# Patient Record
Sex: Female | Born: 1955 | Hispanic: Yes | Marital: Married | Smoking: Never smoker
Health system: Southern US, Community
[De-identification: ages and names within clinical notes are randomized; demographics above are authoritative.]

## PROBLEM LIST (undated history)

## (undated) HISTORY — PX: SHOULDER ACROMIOPLASTY: SHX6093

---

## 2004-09-22 ENCOUNTER — Ambulatory Visit: Payer: Self-pay | Admitting: Family Medicine

## 2006-01-03 ENCOUNTER — Ambulatory Visit: Payer: Self-pay | Admitting: Family Medicine

## 2008-08-19 ENCOUNTER — Emergency Department: Payer: Self-pay | Admitting: Emergency Medicine

## 2010-12-25 ENCOUNTER — Ambulatory Visit: Payer: Self-pay

## 2014-02-24 ENCOUNTER — Ambulatory Visit: Payer: Self-pay | Admitting: Family Medicine

## 2017-06-28 ENCOUNTER — Other Ambulatory Visit: Payer: Self-pay | Admitting: Family Medicine

## 2017-06-28 DIAGNOSIS — Z1231 Encounter for screening mammogram for malignant neoplasm of breast: Secondary | ICD-10-CM

## 2017-06-28 DIAGNOSIS — E2839 Other primary ovarian failure: Secondary | ICD-10-CM

## 2017-06-29 ENCOUNTER — Other Ambulatory Visit: Payer: Self-pay | Admitting: Family Medicine

## 2017-06-29 DIAGNOSIS — J329 Chronic sinusitis, unspecified: Secondary | ICD-10-CM

## 2017-08-17 ENCOUNTER — Ambulatory Visit
Admission: RE | Admit: 2017-08-17 | Discharge: 2017-08-17 | Disposition: A | Discharge status: Home / Self Care | Payer: BLUE CROSS/BLUE SHIELD | Source: Ambulatory Visit | Attending: Family Medicine | Admitting: Family Medicine

## 2017-08-17 ENCOUNTER — Encounter: Payer: Self-pay | Admitting: Radiology

## 2017-08-17 DIAGNOSIS — E2839 Other primary ovarian failure: Secondary | ICD-10-CM | POA: Diagnosis present

## 2017-08-17 DIAGNOSIS — Z1231 Encounter for screening mammogram for malignant neoplasm of breast: Secondary | ICD-10-CM | POA: Insufficient documentation

## 2020-01-30 ENCOUNTER — Other Ambulatory Visit: Payer: Self-pay | Admitting: Family Medicine

## 2020-01-30 DIAGNOSIS — Z1231 Encounter for screening mammogram for malignant neoplasm of breast: Secondary | ICD-10-CM

## 2020-02-26 ENCOUNTER — Other Ambulatory Visit: Payer: Self-pay | Admitting: Pediatrics

## 2020-02-26 ENCOUNTER — Ambulatory Visit
Admission: RE | Admit: 2020-02-26 | Discharge: 2020-02-26 | Disposition: A | Discharge status: Home / Self Care | Payer: Self-pay | Source: Ambulatory Visit | Attending: Pediatrics | Admitting: Pediatrics

## 2020-02-26 ENCOUNTER — Other Ambulatory Visit: Payer: Self-pay

## 2020-02-26 DIAGNOSIS — M7502 Adhesive capsulitis of left shoulder: Secondary | ICD-10-CM

## 2020-03-03 ENCOUNTER — Ambulatory Visit
Admission: RE | Admit: 2020-03-03 | Discharge: 2020-03-03 | Disposition: A | Discharge status: Home / Self Care | Payer: Self-pay | Source: Ambulatory Visit | Attending: Family Medicine | Admitting: Family Medicine

## 2020-03-03 ENCOUNTER — Other Ambulatory Visit: Payer: Self-pay

## 2020-03-03 DIAGNOSIS — Z1231 Encounter for screening mammogram for malignant neoplasm of breast: Secondary | ICD-10-CM | POA: Insufficient documentation

## 2020-12-18 ENCOUNTER — Other Ambulatory Visit: Payer: Self-pay | Admitting: Student

## 2020-12-18 DIAGNOSIS — M8000XK Age-related osteoporosis with current pathological fracture, unspecified site, subsequent encounter for fracture with nonunion: Secondary | ICD-10-CM

## 2022-03-30 ENCOUNTER — Other Ambulatory Visit: Payer: Self-pay

## 2022-03-30 ENCOUNTER — Telehealth: Payer: Self-pay

## 2022-03-30 DIAGNOSIS — Z1211 Encounter for screening for malignant neoplasm of colon: Secondary | ICD-10-CM

## 2022-03-30 NOTE — Telephone Encounter (Signed)
Gastroenterology Pre-Procedure Review  Call made with the assistance of [facility] [place]  Request Date: 05/27/22 Requesting Physician: Dr. Marius Ditch  PATIENT REVIEW QUESTIONS: The patient responded to the following health history questions as indicated:    1. Are you having any GI issues? no 2. Do you have a personal history of Polyps? no 3. Do you have a family history of Colon Cancer or Polyps? no 4. Diabetes Mellitus? no 5. Joint replacements in the past 12 months?no 6. Major health problems in the past 3 months?no 7. Any artificial heart valves, MVP, or defibrillator?no    MEDICATIONS & ALLERGIES:    Patient reports the following regarding taking any anticoagulation/antiplatelet therapy:   Plavix, Coumadin, Eliquis, Xarelto, Lovenox, Pradaxa, Brilinta, or Effient? no Aspirin? no  Patient confirms/reports the following medications:  No current outpatient medications on file.   No current facility-administered medications for this visit.    Patient confirms/reports the following allergies:  Not on File  No orders of the defined types were placed in this encounter.   AUTHORIZATION INFORMATION Primary Insurance: 1D#: Group #:  Secondary Insurance: 1D#: Group #:  SCHEDULE INFORMATION: Date: 05/27/22 Time: Location: [facility]

## 2022-04-23 IMAGING — CR DG SHOULDER 2+V*L*
3 series · 3 of 3 positions shown · non-contrast
Comparison: None.

CLINICAL DATA: Left shoulder pain and limited range of motion. No
known injury.

EXAM:
LEFT SHOULDER - 2+ VIEW

[shoulder grashey]
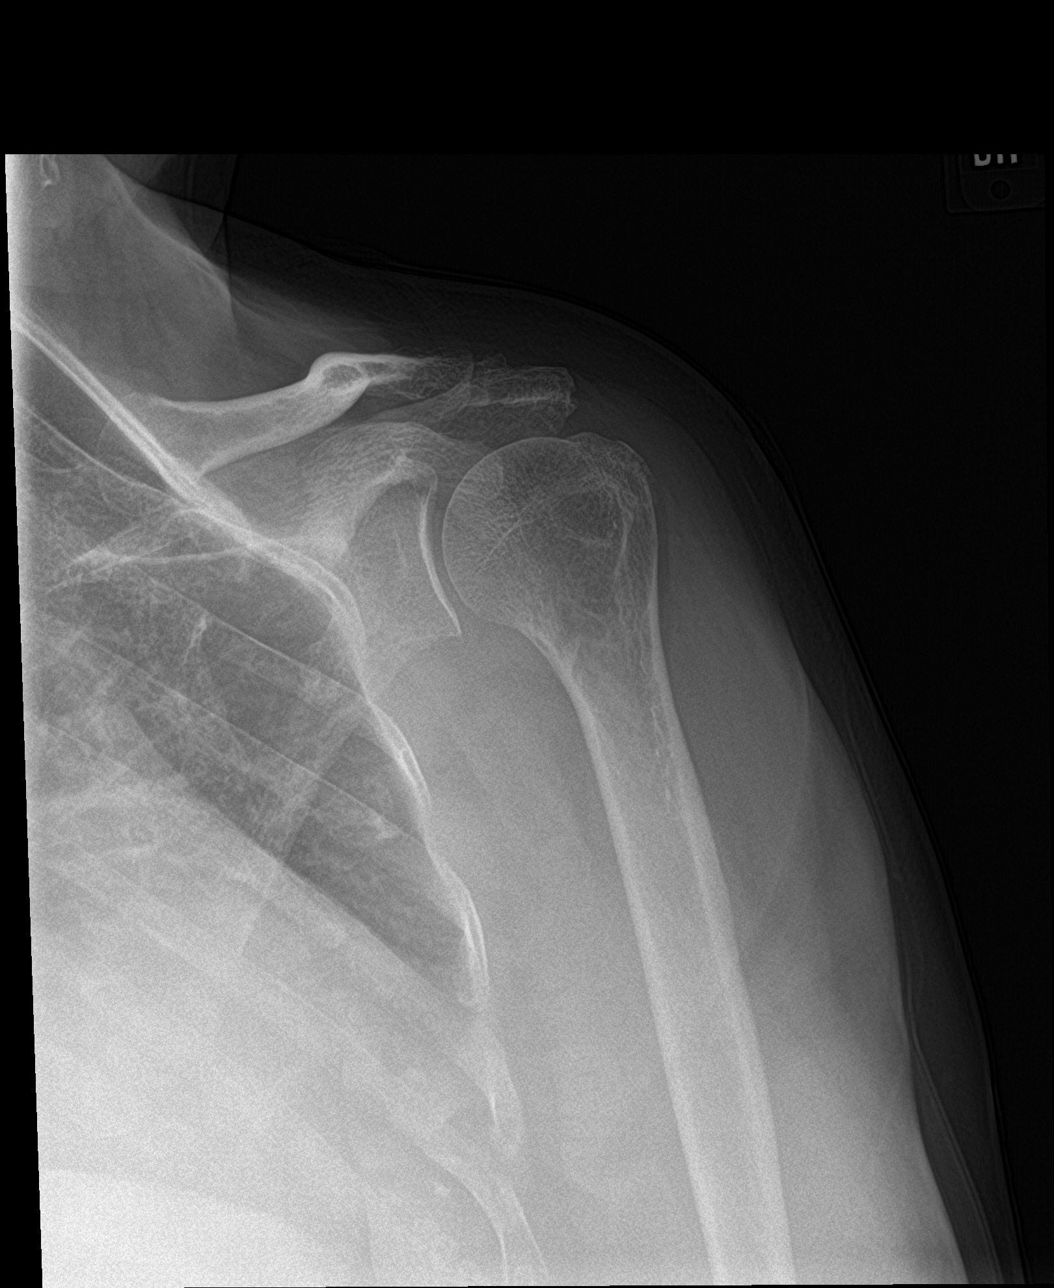

[shoulder y view]
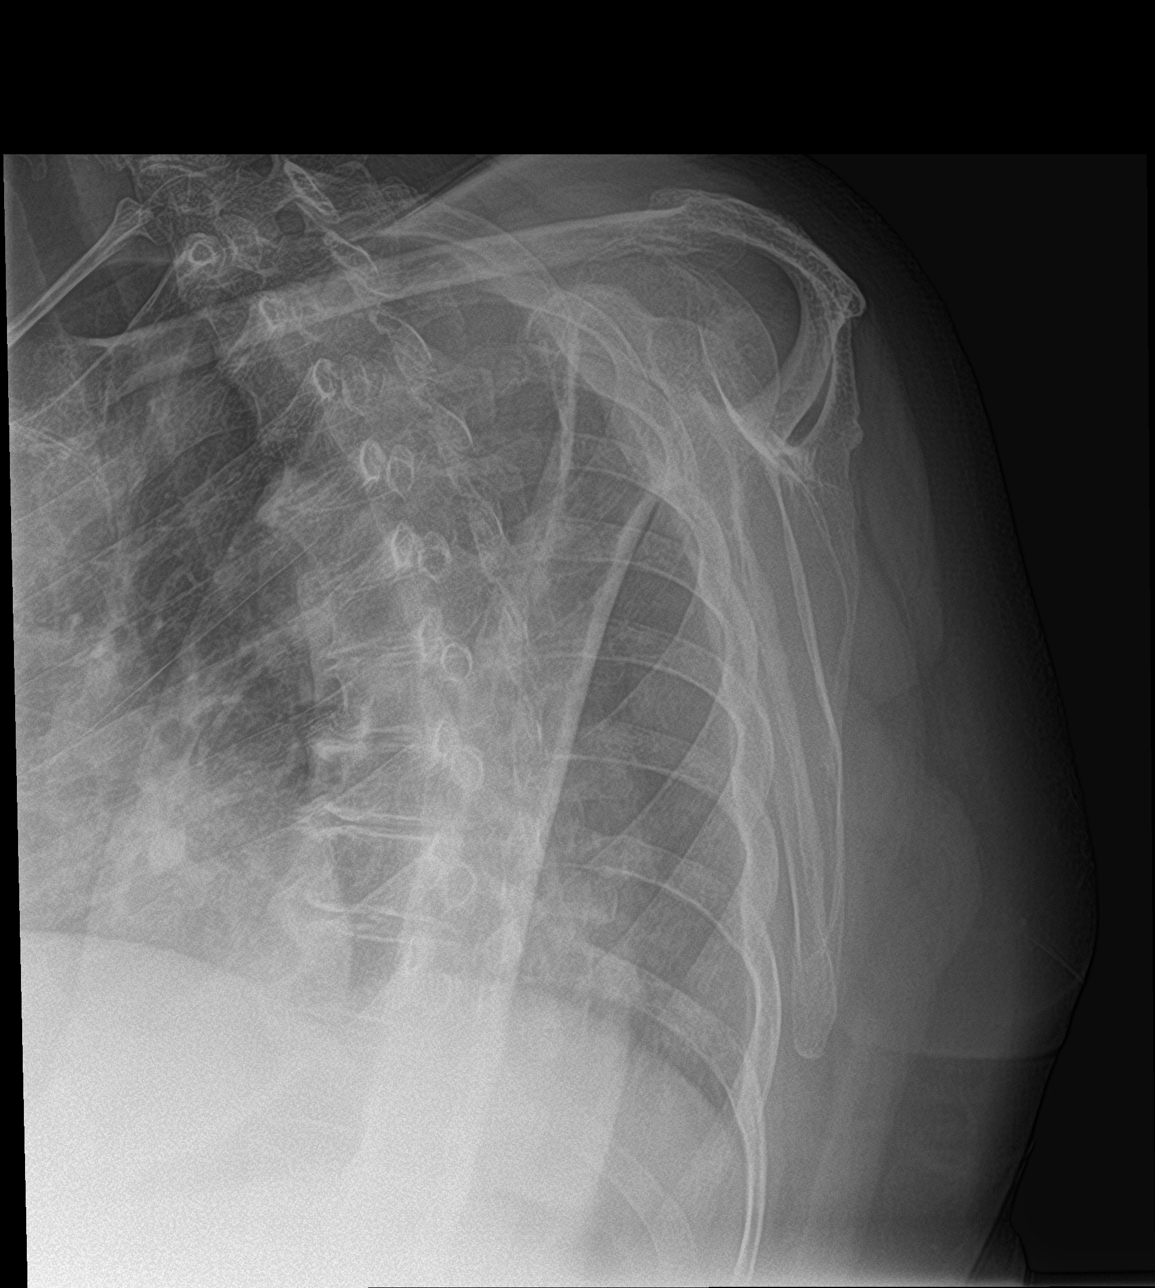

[shoulder axillary]
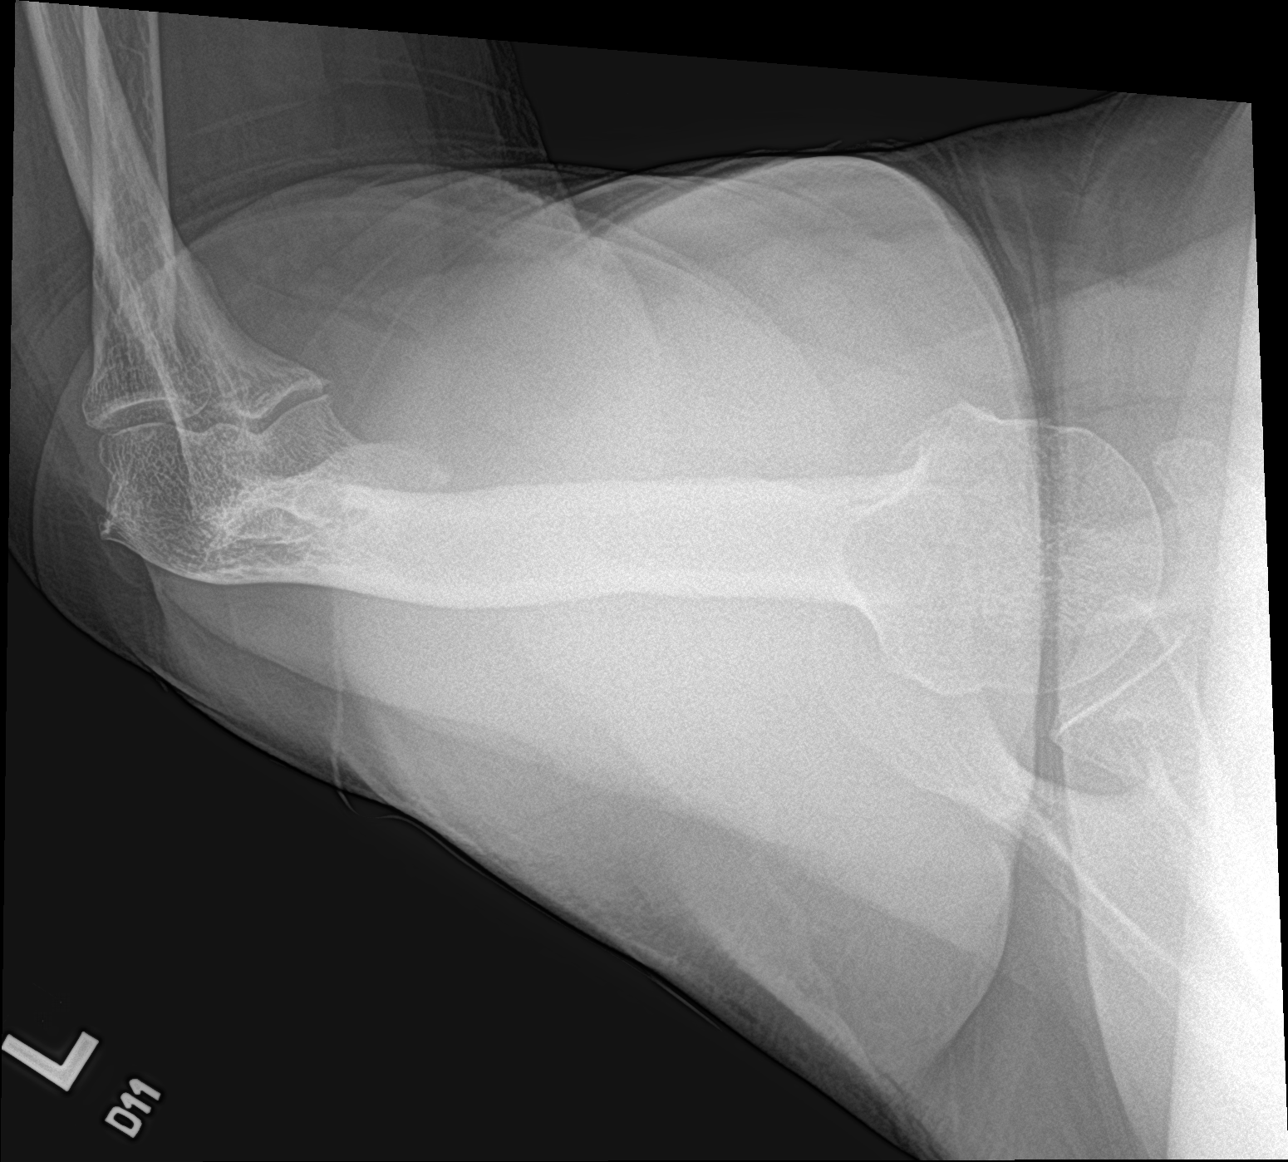

[3 of 3 positions shown; findings below may reference images not displayed]

FINDINGS: There is no evidence of fracture or dislocation. Mild-to-moderate
acromioclavicular osteoarthritis noted. The glenohumeral joint
appears normal. Soft tissues are unremarkable.
IMPRESSION: Mild to moderate acromioclavicular osteoarthritis. Otherwise
negative.

## 2022-05-26 ENCOUNTER — Other Ambulatory Visit: Payer: Self-pay | Admitting: *Deleted

## 2022-05-26 ENCOUNTER — Encounter: Payer: Self-pay | Admitting: Gastroenterology

## 2022-05-26 MED ORDER — NA SULFATE-K SULFATE-MG SULF 17.5-3.13-1.6 GM/177ML PO SOLN: 1.0000 | Freq: Once | ORAL | 0 refills | Status: AC

## 2022-05-27 ENCOUNTER — Encounter
Admission: RE | Disposition: A | Discharge status: Home / Self Care | Payer: Self-pay | Source: Home / Self Care | Attending: Gastroenterology

## 2022-05-27 ENCOUNTER — Ambulatory Visit
Admission: RE | Admit: 2022-05-27 | Discharge: 2022-05-27 | Disposition: A | Discharge status: Home / Self Care | Payer: Medicare HMO | Attending: Gastroenterology | Admitting: Gastroenterology

## 2022-05-27 ENCOUNTER — Encounter: Payer: Self-pay | Admitting: Gastroenterology

## 2022-05-27 ENCOUNTER — Ambulatory Visit: Payer: Medicare HMO | Admitting: Registered Nurse

## 2022-05-27 DIAGNOSIS — K635 Polyp of colon: Secondary | ICD-10-CM | POA: Diagnosis not present

## 2022-05-27 DIAGNOSIS — Z1211 Encounter for screening for malignant neoplasm of colon: Secondary | ICD-10-CM

## 2022-05-27 DIAGNOSIS — G4730 Sleep apnea, unspecified: Secondary | ICD-10-CM | POA: Diagnosis not present

## 2022-05-27 HISTORY — PX: COLONOSCOPY WITH PROPOFOL: SHX5780

## 2022-05-27 SURGERY — COLONOSCOPY WITH PROPOFOL
Anesthesia: General

## 2022-05-27 MED ORDER — ONDANSETRON HCL 4 MG/2ML IJ SOLN
4.0000 mg | Freq: Once | INTRAMUSCULAR | Status: AC
  Administered 2022-05-27: 4 mg via INTRAVENOUS

## 2022-05-27 MED ORDER — PROPOFOL 500 MG/50ML IV EMUL
INTRAVENOUS | Status: DC | PRN
  Administered 2022-05-27: 244.604 ug/kg/min via INTRAVENOUS

## 2022-05-27 MED ORDER — SODIUM CHLORIDE 0.9 % IV SOLN
INTRAVENOUS | Status: DC
  Administered 2022-05-27: 1000 mL via INTRAVENOUS

## 2022-05-27 MED ORDER — ONDANSETRON HCL 4 MG/2ML IJ SOLN
INTRAMUSCULAR | Status: AC
  Filled 2022-05-27: qty 2

## 2022-05-27 MED ORDER — LIDOCAINE HCL (CARDIAC) PF 100 MG/5ML IV SOSY
PREFILLED_SYRINGE | INTRAVENOUS | Status: DC | PRN
  Administered 2022-05-27: 100 mg via INTRAVENOUS

## 2022-05-27 MED ORDER — STERILE WATER FOR IRRIGATION IR SOLN
Status: DC | PRN
  Administered 2022-05-27: 100 mL

## 2022-05-27 MED ORDER — PROPOFOL 10 MG/ML IV BOLUS
INTRAVENOUS | Status: DC | PRN
  Administered 2022-05-27: 80 mg via INTRAVENOUS

## 2022-05-27 NOTE — Anesthesia Preprocedure Evaluation (Addendum)
Anesthesia Evaluation  Patient identified by MRN, date of birth, ID band Patient awake    Reviewed: Allergy & Precautions, NPO status , Patient's Chart, lab work & pertinent test results  History of Anesthesia Complications (+) PROLONGED EMERGENCE and history of anesthetic complications  Airway Mallampati: III  TM Distance: <3 FB Neck ROM: full    Dental  (+) Chipped, Caps   Pulmonary neg shortness of breath, sleep apnea    Pulmonary exam normal        Cardiovascular Exercise Tolerance: Good negative cardio ROS Normal cardiovascular exam     Neuro/Psych negative neurological ROS  negative psych ROS   GI/Hepatic negative GI ROS, Neg liver ROS,neg GERD  ,,  Endo/Other  negative endocrine ROS    Renal/GU negative Renal ROS  negative genitourinary   Musculoskeletal   Abdominal   Peds  Hematology negative hematology ROS (+)   Anesthesia Other Findings History reviewed. No pertinent past medical history.  Past Surgical History: No date: SHOULDER ACROMIOPLASTY; Right     Reproductive/Obstetrics negative OB ROS                             Anesthesia Physical Anesthesia Plan  ASA: 3  Anesthesia Plan: General   Post-op Pain Management:    Induction: Intravenous  PONV Risk Score and Plan: Propofol infusion and TIVA  Airway Management Planned: Natural Airway and Nasal Cannula  Additional Equipment:   Intra-op Plan:   Post-operative Plan:   Informed Consent: I have reviewed the patients History and Physical, chart, labs and discussed the procedure including the risks, benefits and alternatives for the proposed anesthesia with the patient or authorized representative who has indicated his/her understanding and acceptance.     Dental Advisory Given and Interpreter used for interveiw  Plan Discussed with: Anesthesiologist, CRNA and Surgeon  Anesthesia Plan Comments: (Patient  consented for risks of anesthesia including but not limited to:  - adverse reactions to medications - risk of airway placement if required - damage to eyes, teeth, lips or other oral mucosa - nerve damage due to positioning  - sore throat or hoarseness - Damage to heart, brain, nerves, lungs, other parts of body or loss of life  Patient voiced understanding.)       Anesthesia Quick Evaluation

## 2022-05-27 NOTE — Transfer of Care (Signed)
Immediate Anesthesia Transfer of Care Note  Patient: Gentry Roch  Procedure(s) Performed: COLONOSCOPY WITH PROPOFOL  Patient Location: Endoscopy Unit  Anesthesia Type:General  Level of Consciousness: drowsy  Airway & Oxygen Therapy: Patient Spontanous Breathing  Post-op Assessment: Report given to RN and Post -op Vital signs reviewed and stable  Post vital signs: Reviewed and stable  Last Vitals:  Vitals Value Taken Time  BP 96/47 05/27/22 0940  Temp 35.6 C 05/27/22 0938  Pulse 58 05/27/22 0940  Resp 20 05/27/22 0940  SpO2 95 % 05/27/22 0940  Vitals shown include unvalidated device data.  Last Pain:  Vitals:   05/27/22 0938  TempSrc: Temporal  PainSc: Asleep         Complications: No notable events documented.

## 2022-05-27 NOTE — Op Note (Signed)
[facility] Gastroenterology Patient Name: Gina Hoover Procedure Date: 05/27/2022 9:02 AM MRN: [id number] Account #: [id number] Date of Birth: 02-Dec-1955 Admit Type: Outpatient Age: 67 Room: [facility] ENDO ROOM 2 Gender: Female Note Status: Finalized Instrument Name: Prentice Docker [id number] Procedure:             Colonoscopy Indications:           Screening for colorectal malignant neoplasm, This is                         the patient's first colonoscopy Providers:             Toney Reil MD, MD Referring MD:          Toney Reil MD, MD (Referring MD) Medicines:             General Anesthesia Complications:         No immediate complications. Estimated blood loss: None. Procedure:             Pre-Anesthesia Assessment:                        - Prior to the procedure, a History and Physical was                         performed, and patient medications and allergies were                         reviewed. The patient is competent. The risks and                         benefits of the procedure and the sedation options and                         risks were discussed with the patient. All questions                         were answered and informed consent was obtained.                         Patient identification and proposed procedure were                         verified by the physician, the nurse, the                         anesthesiologist, the anesthetist and the technician                         in the pre-procedure area in the procedure room in the                         endoscopy suite. Mental Status Examination: alert and                         oriented. Airway Examination: normal oropharyngeal                         airway and neck mobility. Respiratory Examination:  clear to auscultation. CV Examination: normal.                         Prophylactic Antibiotics: The patient does not require                          prophylactic antibiotics. Prior Anticoagulants: The                         patient has taken no anticoagulant or antiplatelet                         agents. ASA Grade Assessment: III - A patient with                         severe systemic disease. After reviewing the risks and                         benefits, the patient was deemed in satisfactory                         condition to undergo the procedure. The anesthesia                         plan was to use general anesthesia. Immediately prior                         to administration of medications, the patient was                         re-assessed for adequacy to receive sedatives. The                         heart rate, respiratory rate, oxygen saturations,                         blood pressure, adequacy of pulmonary ventilation, and                         response to care were monitored throughout the                         procedure. The physical status of the patient was                         re-assessed after the procedure.                        After obtaining informed consent, the colonoscope was                         passed under direct vision. Throughout the procedure,                         the patient's blood pressure, pulse, and oxygen                         saturations were monitored continuously. The  Colonoscope was introduced through the anus and                         advanced to the the cecum, identified by appendiceal                         orifice and ileocecal valve. The colonoscopy was                         performed without difficulty. The patient tolerated                         the procedure well. The quality of the bowel                         preparation was evaluated using the BBPS [facility] Bowel                         Preparation Scale) with scores of: Right Colon = 3,                         Transverse Colon = 3 and Left Colon = 3 (entire mucosa                          seen well with no residual staining, small fragments                         of stool or opaque liquid). The total BBPS score                         equals 9. The ileocecal valve, appendiceal orifice,                         and rectum were photographed. Findings:      The perianal and digital rectal examinations were normal. Pertinent       negatives include normal sphincter tone and no palpable rectal lesions.      A 3 mm polyp was found in the transverse colon. The polyp was sessile.       The polyp was removed with a cold snare. Resection and retrieval were       complete. Estimated blood loss: none.      The exam was otherwise without abnormality. Impression:            - One 3 mm polyp in the transverse colon, removed with                         a cold snare. Resected and retrieved.                        - The examination was otherwise normal. Recommendation:        - Discharge patient to home (with escort).                        - Resume previous diet today.                        - Continue present medications.                        -  Await pathology results.                        - Repeat colonoscopy in 7-10 years for surveillance                         based on pathology results. Procedure Code(s):     --- Professional ---                        [phone], Colonoscopy, flexible; with removal of                         tumor(s), polyp(s), or other lesion(s) by snare                         technique Diagnosis Code(s):     --- Professional ---                        Z12.11, Encounter for screening for malignant neoplasm                         of colon                        D12.3, Benign neoplasm of transverse colon (hepatic                         flexure or splenic flexure) CPT copyright 2022 American Medical Association. All rights reserved. The codes documented in this report are preliminary and upon coder review may  be revised to meet current  compliance requirements. Dr. Libby Maw Toney Reil MD, MD 05/27/2022 9:35:32 AM This report has been signed electronically. Number of Addenda: 0 Note Initiated On: 05/27/2022 9:02 AM Scope Withdrawal Time: 0 hours 8 minutes 32 seconds  Total Procedure Duration: 0 hours 12 minutes 19 seconds  Estimated Blood Loss:  Estimated blood loss: none.      [facility]

## 2022-05-27 NOTE — Anesthesia Postprocedure Evaluation (Signed)
Anesthesia Post Note  Patient: Gina Hoover  Procedure(s) Performed: COLONOSCOPY WITH PROPOFOL  Patient location during evaluation: Endoscopy Anesthesia Type: General Level of consciousness: awake and alert Pain management: pain level controlled Vital Signs Assessment: post-procedure vital signs reviewed and stable Respiratory status: spontaneous breathing, nonlabored ventilation, respiratory function stable and patient connected to nasal cannula oxygen Cardiovascular status: blood pressure returned to baseline and stable Postop Assessment: no apparent nausea or vomiting Anesthetic complications: no   No notable events documented.   Last Vitals:  Vitals:   05/27/22 0902 05/27/22 0938  BP: (!) 131/54 (!) 96/47  Pulse: (!) 53 (!) 58  Resp: 18 19  Temp: (!) 36.1 C (!) 35.6 C  SpO2: 97% 96%    Last Pain:  Vitals:   05/27/22 0938  TempSrc: Temporal  PainSc: Asleep                 Cleda Mccreedy [name]

## 2022-05-27 NOTE — H&P (Signed)
  Arlyss Repress, MD [address]  [address]  [place], [place] [address]  Main: [phone]  Fax: [phone] Pager: [phone]  Primary Care Physician:  Preston Fleeting, MD Primary Gastroenterologist:  Dr. Arlyss Repress  Pre-Procedure History & Physical: HPI:  Gina Hoover is a 67 y.o. female is here for an colonoscopy.   History reviewed. No pertinent past medical history.  Past Surgical History:  Procedure Laterality Date   SHOULDER ACROMIOPLASTY Right     Prior to Admission medications   Medication Sig Start Date End Date Taking? Authorizing Provider  Vitamin D, Ergocalciferol, (DRISDOL) 1.25 MG (50000 UNIT) CAPS capsule Take 50,000 Units by mouth every 7 (seven) days.   Yes [provider]    Allergies as of 03/30/2022   (No Known Allergies)    History reviewed. No pertinent family history.  Social History   Socioeconomic History   Marital status: Married    Spouse name: Not on file   Number of children: Not on file   Years of education: Not on file   Highest education level: Not on file  Occupational History   Not on file  Tobacco Use   Smoking status: Never   Smokeless tobacco: Never  Vaping Use   Vaping Use: Never used  Substance and Sexual Activity   Alcohol use: Never   Drug use: Never   Sexual activity: Not on file  Other Topics Concern   Not on file  Social History Narrative   Not on file   Social Determinants of Health   Financial Resource Strain: Not on file  Food Insecurity: Not on file  Transportation Needs: Not on file  Physical Activity: Not on file  Stress: Not on file  Social Connections: Not on file  Intimate Partner Violence: Not on file    Review of Systems: See HPI, otherwise negative ROS  Physical Exam: BP (!) 96/47   Pulse (!) 58   Temp (!) 96 F (35.6 C) (Temporal)   Resp 19   Ht 4\' 6"  (1.372 m)   Wt 153 lb 4.9 oz (69.5 kg)   SpO2 96%   BMI 36.96 kg/m  General:    Alert,  pleasant and cooperative in NAD Head:  Normocephalic and atraumatic. Neck:  Supple; no masses or thyromegaly. Lungs:  Clear throughout to auscultation.    Heart:  Regular rate and rhythm. Abdomen:  Soft, nontender and nondistended. Normal bowel sounds, without guarding, and without rebound.   Neurologic:  Alert and  oriented x4;  grossly normal neurologically.  Impression/Plan: Gina Hoover is here for an colonoscopy to be performed for colon cancer screening  Risks, benefits, limitations, and alternatives regarding  colonoscopy have been reviewed with the patient.  Questions have been answered.  All parties agreeable.   Lannette Donath, MD  05/27/2022, 9:42 AM

## 2022-05-27 NOTE — OR Nursing (Signed)
Pt suddenly reports abdominal. Started wretching. Vomited small amount of clear fluid . Rn spoke with DR Earnie Larsson WHO ORDERED ZOFRAN 4MG  IVP. RN ADM MED PER MD ORDERS

## 2022-05-30 ENCOUNTER — Encounter: Payer: Self-pay | Admitting: Gastroenterology

## 2022-05-30 LAB — SURGICAL PATHOLOGY

## 2022-08-23 ENCOUNTER — Other Ambulatory Visit: Payer: Self-pay | Admitting: Physician Assistant

## 2022-08-23 DIAGNOSIS — G500 Trigeminal neuralgia: Secondary | ICD-10-CM

## 2022-09-08 ENCOUNTER — Encounter: Payer: Self-pay | Admitting: Physician Assistant

## 2022-09-13 ENCOUNTER — Ambulatory Visit
Admission: RE | Admit: 2022-09-13 | Discharge: 2022-09-13 | Disposition: A | Discharge status: Home / Self Care | Payer: Medicare HMO | Source: Ambulatory Visit | Attending: Physician Assistant | Admitting: Physician Assistant

## 2022-09-13 DIAGNOSIS — G500 Trigeminal neuralgia: Secondary | ICD-10-CM

## 2022-09-13 MED ORDER — GADOPICLENOL 0.5 MMOL/ML IV SOLN
7.5000 mL | Freq: Once | INTRAVENOUS | Status: AC | PRN
  Administered 2022-09-13: 7 mL via INTRAVENOUS

## 2022-09-26 ENCOUNTER — Other Ambulatory Visit: Payer: Self-pay | Admitting: Physician Assistant

## 2022-09-26 DIAGNOSIS — I729 Aneurysm of unspecified site: Secondary | ICD-10-CM

## 2022-10-03 ENCOUNTER — Ambulatory Visit
Admission: RE | Admit: 2022-10-03 | Discharge: 2022-10-03 | Disposition: A | Discharge status: Home / Self Care | Payer: Medicare HMO | Source: Ambulatory Visit | Attending: Physician Assistant | Admitting: Physician Assistant

## 2022-10-03 DIAGNOSIS — I729 Aneurysm of unspecified site: Secondary | ICD-10-CM

## 2022-10-03 MED ORDER — IOPAMIDOL (ISOVUE-370) INJECTION 76%
75.0000 mL | Freq: Once | INTRAVENOUS | Status: AC | PRN
  Administered 2022-10-03: 75 mL via INTRAVENOUS

## 2023-01-29 ENCOUNTER — Emergency Department
Admission: EM | Admit: 2023-01-29 | Discharge: 2023-01-29 | Discharge status: Against Medical Advice | Payer: Medicare HMO | Attending: Emergency Medicine | Admitting: Emergency Medicine

## 2023-01-29 DIAGNOSIS — R410 Disorientation, unspecified: Secondary | ICD-10-CM | POA: Insufficient documentation

## 2023-01-29 DIAGNOSIS — Z5321 Procedure and treatment not carried out due to patient leaving prior to being seen by health care provider: Secondary | ICD-10-CM | POA: Diagnosis not present

## 2023-01-29 DIAGNOSIS — R55 Syncope and collapse: Secondary | ICD-10-CM | POA: Insufficient documentation

## 2023-01-29 DIAGNOSIS — R251 Tremor, unspecified: Secondary | ICD-10-CM | POA: Diagnosis not present

## 2023-01-29 DIAGNOSIS — R0602 Shortness of breath: Secondary | ICD-10-CM | POA: Diagnosis present

## 2023-01-29 NOTE — ED Triage Notes (Signed)
 Pt comes via EMS from [place] with c/o sob and syncopal episode. Pt had some shaking maybe seizure and was confused afterwards.  Pt at baseline now. Family with pt.   VSS

## 2023-01-29 NOTE — ED Notes (Signed)
 IV removed and pt wanting to go somewhere else. Family with pt. Pt ambulatory out of ED lobby.

## 2023-08-10 ENCOUNTER — Other Ambulatory Visit: Payer: Self-pay | Admitting: Family Medicine

## 2023-08-10 ENCOUNTER — Ambulatory Visit
Admission: RE | Admit: 2023-08-10 | Discharge: 2023-08-10 | Disposition: A | Discharge status: Home / Self Care | Source: Ambulatory Visit | Attending: Family Medicine | Admitting: Family Medicine

## 2023-08-10 DIAGNOSIS — M25551 Pain in right hip: Secondary | ICD-10-CM

## 2023-09-02 ENCOUNTER — Ambulatory Visit
Admission: EM | Admit: 2023-09-02 | Discharge: 2023-09-02 | Disposition: A | Discharge status: Home / Self Care | Attending: Emergency Medicine | Admitting: Emergency Medicine

## 2023-09-02 ENCOUNTER — Encounter: Payer: Self-pay | Admitting: Emergency Medicine

## 2023-09-02 DIAGNOSIS — U071 COVID-19: Secondary | ICD-10-CM | POA: Diagnosis not present

## 2023-09-02 DIAGNOSIS — B349 Viral infection, unspecified: Secondary | ICD-10-CM

## 2023-09-02 LAB — POC SOFIA SARS ANTIGEN FIA: SARS Coronavirus 2 Ag: POSITIVE — AB

## 2023-09-02 MED ORDER — ONDANSETRON 4 MG PO TBDP: 4.0000 mg | ORAL_TABLET | Freq: Three times a day (TID) | ORAL | 0 refills | Status: AC | PRN

## 2023-09-02 MED ORDER — MECLIZINE HCL 12.5 MG PO TABS: 12.5000 mg | ORAL_TABLET | Freq: Three times a day (TID) | ORAL | 0 refills | Status: AC | PRN

## 2023-09-02 MED ORDER — BACLOFEN 5 MG PO TABS: 5.0000 mg | ORAL_TABLET | Freq: Three times a day (TID) | ORAL | 0 refills | Status: AC

## 2023-09-02 MED ORDER — PREDNISONE 10 MG (21) PO TBPK: ORAL_TABLET | Freq: Every day | ORAL | 0 refills | Status: AC

## 2023-09-02 NOTE — ED Triage Notes (Signed)
 Patient complains of  head ache nausea, body aches, chills, fever of 101 on yesterday.  Patient has been taking OTC cough meds Tylenol cold and flu. Last dose last night. Rate pain 8/10.

## 2023-09-02 NOTE — Discharge Instructions (Addendum)
 Covid 19 is a virus and should steadily improve in time it can take up to 7 to 10 days before you truly start to see a turnaround however things will get better    Per the CDC you will need to quarantine and to your 24 hours without fever, if no fever may continue activity wearing mask  You may use meclizine  every 8 hours as needed for dizziness  May use Zofran  every 8 hours as needed for nausea, placed under tongue and allow to dissolve  May use baclofen  every 8 hours as needed for body aches  Begin prednisone  every morning with food to reduce inflammation and help with chest discomfort do not take ibuprofen with this medicine  You can take Tylenol as needed for fever reduction and pain relief.   For cough: honey 1/2 to 1 teaspoon (you can dilute the honey in water  or another fluid).  You can also use guaifenesin and dextromethorphan for cough. You can use a humidifier for chest congestion and cough.  If you don't have a humidifier, you can sit in the bathroom with the hot shower running.      For sore throat: try warm salt water  gargles, cepacol lozenges, throat spray, warm tea or water  with lemon/honey, popsicles or ice, or OTC cold relief medicine for throat discomfort.   For congestion: take a daily anti-histamine like Zyrtec, Claritin, and a oral decongestant, such as pseudoephedrine.  You can also use Flonase 1-2 sprays in each nostril daily.   It is important to stay hydrated: drink plenty of fluids (water , gatorade/powerade/pedialyte, juices, or teas) to keep your throat moisturized and help further relieve irritation/discomfort.

## 2023-09-02 NOTE — ED Provider Notes (Signed)
 [facility]    CSN: [id number] Arrival date & time: 09/02/23  1452      History   Chief Complaint Chief Complaint  Patient presents with   Generalized Body Aches   Fever   Headache   Chills    HPI [facility] Gina Hoover is a 68 y.o. female.   Patient presents for evaluation of fever, mild nonproductive cough, nasal congestion, sore throat, centralized chest pain, centralized abdominal pain and nausea without vomiting beginning 1 day ago.  Known sick contacts in household.  Decreased appetite but tolerable to some food and liquids.  Has not attempted treatment.  History reviewed. No pertinent past medical history.  Patient Active Problem List   Diagnosis Date Noted   Encounter for screening colonoscopy 05/27/2022   Polyp of descending colon 05/27/2022    Past Surgical History:  Procedure Laterality Date   COLONOSCOPY WITH PROPOFOL  N/A 05/27/2022   Procedure: COLONOSCOPY WITH PROPOFOL ;  Surgeon: Unk Corinn Skiff, MD;  Location: [facility] ENDOSCOPY;  Service: Gastroenterology;  Laterality: N/A;  SPANISH INTERPRETER   SHOULDER ACROMIOPLASTY Right     OB History   No obstetric history on file.      Home Medications    Prior to Admission medications   Medication Sig Start Date End Date Taking? Authorizing Provider  aspirin 81 MG chewable tablet Chew 81 mg by mouth daily. 06/28/23 12/25/23 Yes [provider]  baclofen  5 MG TABS Take 1 tablet (5 mg total) by mouth 3 (three) times daily. 09/02/23  Yes Odin Mariani R, NP  ipratropium (ATROVENT) 0.06 % nasal spray Place 1 spray into both nostrils. 09/02/23  Yes [provider]  meclizine  (ANTIVERT ) 12.5 MG tablet Take 1 tablet (12.5 mg total) by mouth 3 (three) times daily as needed for dizziness. 09/02/23  Yes Xayvion Shirah R, NP  ondansetron  (ZOFRAN -ODT) 4 MG disintegrating tablet Take 1 tablet (4 mg total) by mouth every 8 (eight) hours as needed. 09/02/23  Yes Joandy Burget R, NP   predniSONE  (STERAPRED UNI-PAK 21 TAB) 10 MG (21) TBPK tablet Take by mouth daily. Take 6 tabs by mouth daily  for 1 days, then 5 tabs for 1 days, then 4 tabs for 1 days, then 3 tabs for 1 days, 2 tabs for 1 days, then 1 tab by mouth daily for 1 days 09/02/23  Yes Crucita Lacorte R, NP  pregabalin (LYRICA) 25 MG capsule Take 25 mg by mouth 2 (two) times daily. 06/20/23  Yes [provider]  pregabalin (LYRICA) 50 MG capsule Take 50 mg by mouth 2 (two) times daily. 08/03/23  Yes [provider]  Vitamin D, Ergocalciferol, (DRISDOL) 1.25 MG (50000 UNIT) CAPS capsule Take 50,000 Units by mouth every 7 (seven) days.    [provider]    Family History History reviewed. No pertinent family history.  Social History Social History   Tobacco Use   Smoking status: Never   Smokeless tobacco: Never  Vaping Use   Vaping status: Never Used  Substance Use Topics   Alcohol use: Never   Drug use: Never     Allergies   Patient has no known allergies.   Review of Systems Review of Systems   Physical Exam Triage Vital Signs ED Triage Vitals  Encounter Vitals Group     BP 09/02/23 1508 119/80     Girls Systolic BP Percentile --      Girls Diastolic BP Percentile --      Boys Systolic BP Percentile --  Boys Diastolic BP Percentile --      Pulse Rate 09/02/23 1508 71     Resp 09/02/23 1508 18     Temp 09/02/23 1508 98.9 F (37.2 C)     Temp Source 09/02/23 1508 Oral     SpO2 09/02/23 1508 98 %     Weight --      Height --      Head Circumference --      Peak Flow --      Pain Score 09/02/23 1504 8     Pain Loc --      Pain Education --      Exclude from Growth Chart --    No data found.  Updated Vital Signs BP 119/80 (BP Location: Left Arm)   Pulse 71   Temp 98.9 F (37.2 C) (Oral)   Resp 18   SpO2 98%   Visual Acuity Right Eye Distance:   Left Eye Distance:   Bilateral Distance:    Right Eye Near:   Left Eye Near:    Bilateral Near:      Physical Exam Constitutional:      Appearance: Normal appearance.  HENT:     Head: Normocephalic.     Right Ear: Tympanic membrane, ear canal and external ear normal.     Left Ear: Tympanic membrane, ear canal and external ear normal.     Nose: Congestion present.     Mouth/Throat:     Mouth: Mucous membranes are moist.     Pharynx: Oropharynx is clear.  Eyes:     Extraocular Movements: Extraocular movements intact.  Cardiovascular:     Rate and Rhythm: Normal rate and regular rhythm.     Pulses: Normal pulses.     Heart sounds: Normal heart sounds.  Pulmonary:     Effort: Pulmonary effort is normal.     Breath sounds: Normal breath sounds.  Musculoskeletal:     Cervical back: Normal range of motion and neck supple.  Neurological:     Mental Status: She is alert and oriented to person, place, and time. Mental status is at baseline.      UC Treatments / Results  Labs (all labs ordered are listed, but only abnormal results are displayed) Labs Reviewed  POC SOFIA SARS ANTIGEN FIA - Abnormal; Notable for the following components:      Result Value   SARS Coronavirus 2 Ag Positive (*)    All other components within normal limits    EKG   Radiology No results found.  Procedures Procedures (including critical care time)  Medications Ordered in UC Medications - No data to display  Initial Impression / Assessment and Plan / UC Course  I have reviewed the triage vital signs and the nursing notes.  Pertinent labs & imaging results that were available during my care of the patient were reviewed by me and considered in my medical decision making (see chart for details).  COVID-19, viral illness  Patient is in no signs of distress nor toxic appearing.  Vital signs are stable.  Low suspicion for pneumonia, pneumothorax or bronchitis and therefore will defer imaging.  Discussed quarantine.  Prescribed meclizine , Zofran , baclofen  and prednisone . May use additional  over-the-counter medications as needed for supportive care.  May follow-up with urgent care as needed if symptoms persist or worsen.   Final Clinical Impressions(s) / UC Diagnoses   Final diagnoses:  Viral illness  COVID-19     Discharge Instructions  Covid 19 is a virus and should steadily improve in time it can take up to 7 to 10 days before you truly start to see a turnaround however things will get better    Per the CDC you will need to quarantine and to your 24 hours without fever, if no fever may continue activity wearing mask  You may use meclizine  every 8 hours as needed for dizziness  May use Zofran  every 8 hours as needed for nausea, placed under tongue and allow to dissolve  May use baclofen  every 8 hours as needed for body aches  Begin prednisone  every morning with food to reduce inflammation and help with chest discomfort do not take ibuprofen with this medicine  You can take Tylenol as needed for fever reduction and pain relief.   For cough: honey 1/2 to 1 teaspoon (you can dilute the honey in water  or another fluid).  You can also use guaifenesin and dextromethorphan for cough. You can use a humidifier for chest congestion and cough.  If you don't have a humidifier, you can sit in the bathroom with the hot shower running.      For sore throat: try warm salt water  gargles, cepacol lozenges, throat spray, warm tea or water  with lemon/honey, popsicles or ice, or OTC cold relief medicine for throat discomfort.   For congestion: take a daily anti-histamine like Zyrtec, Claritin, and a oral decongestant, such as pseudoephedrine.  You can also use Flonase 1-2 sprays in each nostril daily.   It is important to stay hydrated: drink plenty of fluids (water , gatorade/powerade/pedialyte, juices, or teas) to keep your throat moisturized and help further relieve irritation/discomfort.    ED Prescriptions     Medication Sig Dispense Auth. Provider   meclizine  (ANTIVERT )  12.5 MG tablet Take 1 tablet (12.5 mg total) by mouth 3 (three) times daily as needed for dizziness. 30 tablet Hamsa Laurich R, NP   ondansetron  (ZOFRAN -ODT) 4 MG disintegrating tablet Take 1 tablet (4 mg total) by mouth every 8 (eight) hours as needed. 20 tablet Jesselle Laflamme R, NP   baclofen  5 MG TABS Take 1 tablet (5 mg total) by mouth 3 (three) times daily. 20 tablet Juelz Claar R, NP   predniSONE  (STERAPRED UNI-PAK 21 TAB) 10 MG (21) TBPK tablet Take by mouth daily. Take 6 tabs by mouth daily  for 1 days, then 5 tabs for 1 days, then 4 tabs for 1 days, then 3 tabs for 1 days, 2 tabs for 1 days, then 1 tab by mouth daily for 1 days 21 tablet Rachelanne Whidby, Shelba SAUNDERS, NP      PDMP not reviewed this encounter.   Teresa Shelba SAUNDERS, NP 09/02/23 [phone]

## 2024-01-05 ENCOUNTER — Encounter: Payer: Self-pay | Admitting: Emergency Medicine

## 2024-01-05 ENCOUNTER — Ambulatory Visit
Admission: EM | Admit: 2024-01-05 | Discharge: 2024-01-05 | Disposition: A | Discharge status: Home / Self Care | Attending: Emergency Medicine | Admitting: Emergency Medicine

## 2024-01-05 DIAGNOSIS — B349 Viral infection, unspecified: Secondary | ICD-10-CM

## 2024-01-05 DIAGNOSIS — U071 COVID-19: Secondary | ICD-10-CM

## 2024-01-05 LAB — POC COVID19/FLU A&B COMBO
Covid Antigen, POC: POSITIVE — AB
Influenza A Antigen, POC: NEGATIVE
Influenza B Antigen, POC: NEGATIVE

## 2024-01-05 MED ORDER — PAXLOVID (300/100) 20 X 150 MG & 10 X 100MG PO TBPK: 3.0000 | ORAL_TABLET | Freq: Two times a day (BID) | ORAL | 0 refills | Status: AC

## 2024-01-05 NOTE — ED Provider Notes (Signed)
 " [facility]    CSN: [id number] Arrival date & time: 01/05/24  1612      History   Chief Complaint Chief Complaint  Patient presents with   Facial Pain   Generalized Body Aches    HPI Gina Hoover is a 68 y.o. female.    Presents for evaluation of sinus pain and pressure to the bridge of the nose, generalized abdominal pain, nausea without.  Exposure to influenza within household.  Has attempted use of NyQuil TheraFlu and nasal spray which has been minimally effective.  Tolerable food and liquids.  Denies fever, cough.    History reviewed. No pertinent past medical history.  Patient Active Problem List   Diagnosis Date Noted   Encounter for screening colonoscopy 05/27/2022   Polyp of descending colon 05/27/2022    Past Surgical History:  Procedure Laterality Date   COLONOSCOPY WITH PROPOFOL  N/A 05/27/2022   Procedure: COLONOSCOPY WITH PROPOFOL ;  Surgeon: Unk Corinn Skiff, MD;  Location: [facility] ENDOSCOPY;  Service: Gastroenterology;  Laterality: N/A;  SPANISH INTERPRETER   SHOULDER ACROMIOPLASTY Right     OB History   No obstetric history on file.      Home Medications    Prior to Admission medications  Medication Sig Start Date End Date Taking? Authorizing Provider  aspirin 81 MG chewable tablet Chew 81 mg by mouth daily. 06/28/23  Yes [provider]  baclofen  5 MG TABS Take 1 tablet (5 mg total) by mouth 3 (three) times daily. 09/02/23   Rhyleigh Grassel R, NP  ipratropium (ATROVENT) 0.06 % nasal spray Place 1 spray into both nostrils. 09/02/23   [provider]  meclizine  (ANTIVERT ) 12.5 MG tablet Take 1 tablet (12.5 mg total) by mouth 3 (three) times daily as needed for dizziness. 09/02/23   Teresa Shelba SAUNDERS, NP  metFORMIN (GLUCOPHAGE) 500 MG tablet Take 500 mg by mouth 2 (two) times daily.    [provider]  ondansetron  (ZOFRAN -ODT) 4 MG disintegrating tablet Take 1 tablet (4 mg total) by mouth every 8  (eight) hours as needed. 09/02/23   Vestal Crandall, Shelba SAUNDERS, NP  predniSONE  (STERAPRED UNI-PAK 21 TAB) 10 MG (21) TBPK tablet Take by mouth daily. Take 6 tabs by mouth daily  for 1 days, then 5 tabs for 1 days, then 4 tabs for 1 days, then 3 tabs for 1 days, 2 tabs for 1 days, then 1 tab by mouth daily for 1 days 09/02/23   Teresa Shelba SAUNDERS, NP  pregabalin (LYRICA) 25 MG capsule Take 25 mg by mouth 2 (two) times daily. 06/20/23   [provider]  pregabalin (LYRICA) 50 MG capsule Take 50 mg by mouth 2 (two) times daily. 08/03/23   [provider]  Vitamin D, Ergocalciferol, (DRISDOL) 1.25 MG (50000 UNIT) CAPS capsule Take 50,000 Units by mouth every 7 (seven) days.    [provider]    Family History History reviewed. No pertinent family history.  Social History Social History[1]   Allergies   Patient has no known allergies.   Review of Systems Review of Systems  Constitutional: Negative.   HENT:  Positive for sinus pressure and sinus pain. Negative for congestion, dental problem, drooling, ear discharge, ear pain, facial swelling, hearing loss, mouth sores, nosebleeds, postnasal drip, rhinorrhea, sneezing, sore throat, tinnitus, trouble swallowing and voice change.   Respiratory: Negative.    Cardiovascular: Negative.   Gastrointestinal:  Positive for abdominal pain and nausea. Negative for abdominal distention, anal bleeding, blood  in stool, constipation, diarrhea, rectal pain and vomiting.  Musculoskeletal:  Positive for myalgias. Negative for arthralgias, back pain, gait problem, joint swelling, neck pain and neck stiffness.  Neurological: Negative.      Physical Exam Triage Vital Signs ED Triage Vitals  Encounter Vitals Group     BP 01/05/24 1703 106/68     Girls Systolic BP Percentile --      Girls Diastolic BP Percentile --      Boys Systolic BP Percentile --      Boys Diastolic BP Percentile --      Pulse Rate 01/05/24 1703 67     Resp 01/05/24 1703  17     Temp 01/05/24 1703 97.9 F (36.6 C)     Temp Source 01/05/24 1703 Oral     SpO2 01/05/24 1703 97 %     Weight --      Height --      Head Circumference --      Peak Flow --      Pain Score 01/05/24 1700 6     Pain Loc --      Pain Education --      Exclude from Growth Chart --    No data found.  Updated Vital Signs BP 106/68 (BP Location: Left Arm)   Pulse 67   Temp 97.9 F (36.6 C) (Oral)   Resp 17   SpO2 97%   Visual Acuity Right Eye Distance:   Left Eye Distance:   Bilateral Distance:    Right Eye Near:   Left Eye Near:    Bilateral Near:     Physical Exam   UC Treatments / Results  Labs (all labs ordered are listed, but only abnormal results are displayed) Labs Reviewed  POC COVID19/FLU A&B COMBO    EKG   Radiology No results found.  Procedures Procedures (including critical care time)  Medications Ordered in UC Medications - No data to display  Initial Impression / Assessment and Plan / UC Course  I have reviewed the triage vital signs and the nursing notes.  Pertinent labs & imaging results that were available during my care of the patient were reviewed by me and considered in my medical decision making (see chart for details).  COVID-19, viral illness  Patient is in no signs of distress nor toxic appearing.  Vital signs are stable.  Low suspicion for pneumonia, pneumothorax or bronchitis and therefore will defer imaging.  Discussed quarantine per the CDC.  Prescribed Paxlovid and discussed administration.May use additional over-the-counter medications as needed for supportive care.  May follow-up with urgent care as needed if symptoms persist or worsen.    Final Clinical Impressions(s) / UC Diagnoses   Final diagnoses:  Viral illness   Discharge Instructions   None    ED Prescriptions   None    PDMP not reviewed this encounter.     [1]  Social History Tobacco Use   Smoking status: Never   Smokeless tobacco: Never   Vaping Use   Vaping status: Never Used  Substance Use Topics   Alcohol use: Never   Drug use: Never     Teresa Shelba SAUNDERS, NP 01/05/24 1824  "

## 2024-01-05 NOTE — ED Triage Notes (Signed)
 Patient complains body aches , sinus pressure, and decrease appetite x 3 days. Patient has taken Thera-flu and Nyquil for symptoms. With mild relief.  Rates body aches 6/10 and sinus pressure 4/10.

## 2024-01-05 NOTE — Discharge Instructions (Addendum)
 Covid 19 is a virus and should steadily improve in time it can take up to 7 to 10 days before you truly start to see a turnaround however things will get better    Per the CDC you will need to quarantine and to your 24 hours without fever, if no fever may continue activity wearing mask  Take Paxlovid twice daily for 5 days to reduce the amount of germ in the body helping to minimize severity of your symptoms and timeline that you are sick  You can take Tylenol and/or Ibuprofen as needed for fever reduction and pain relief.   For cough: honey 1/2 to 1 teaspoon (you can dilute the honey in water  or another fluid).  You can also use guaifenesin and dextromethorphan for cough. You can use a humidifier for chest congestion and cough.  If you don't have a humidifier, you can sit in the bathroom with the hot shower running.      For sore throat: try warm salt water  gargles, cepacol lozenges, throat spray, warm tea or water  with lemon/honey, popsicles or ice, or OTC cold relief medicine for throat discomfort.   For congestion: take a daily anti-histamine like Zyrtec, Claritin, and a oral decongestant, such as pseudoephedrine.  You can also use Flonase 1-2 sprays in each nostril daily.   It is important to stay hydrated: drink plenty of fluids (water , gatorade/powerade/pedialyte, juices, or teas) to keep your throat moisturized and help further relieve irritation/discomfort.
